# Patient Record
Sex: Male | Born: 1965 | Hispanic: No | Smoking: Current every day smoker
Health system: Southern US, Community
[De-identification: ages and names within clinical notes are randomized; demographics above are authoritative.]

---

## 2002-05-16 ENCOUNTER — Emergency Department (HOSPITAL_COMMUNITY): Admission: AC | Admit: 2002-05-16 | Discharge: 2002-05-16 | Payer: Self-pay

## 2017-04-06 ENCOUNTER — Encounter (HOSPITAL_COMMUNITY): Payer: Self-pay

## 2017-04-06 ENCOUNTER — Emergency Department (HOSPITAL_COMMUNITY): Payer: Self-pay

## 2017-04-06 ENCOUNTER — Emergency Department (HOSPITAL_COMMUNITY)
Admission: EM | Admit: 2017-04-06 | Discharge: 2017-04-06 | Disposition: A | Discharge status: Home / Self Care | Payer: Self-pay | Attending: Emergency Medicine | Admitting: Emergency Medicine

## 2017-04-06 ENCOUNTER — Other Ambulatory Visit: Payer: Self-pay

## 2017-04-06 DIAGNOSIS — M542 Cervicalgia: Secondary | ICD-10-CM | POA: Insufficient documentation

## 2017-04-06 DIAGNOSIS — J039 Acute tonsillitis, unspecified: Secondary | ICD-10-CM | POA: Insufficient documentation

## 2017-04-06 DIAGNOSIS — K420 Umbilical hernia with obstruction, without gangrene: Secondary | ICD-10-CM | POA: Insufficient documentation

## 2017-04-06 DIAGNOSIS — F17210 Nicotine dependence, cigarettes, uncomplicated: Secondary | ICD-10-CM | POA: Insufficient documentation

## 2017-04-06 DIAGNOSIS — R590 Localized enlarged lymph nodes: Secondary | ICD-10-CM | POA: Insufficient documentation

## 2017-04-06 DIAGNOSIS — Z79899 Other long term (current) drug therapy: Secondary | ICD-10-CM | POA: Insufficient documentation

## 2017-04-06 LAB — BASIC METABOLIC PANEL
ANION GAP: 10 (ref 5–15)
BUN: 10 mg/dL (ref 6–20)
CHLORIDE: 102 mmol/L (ref 101–111)
CO2: 25 mmol/L (ref 22–32)
Calcium: 9.4 mg/dL (ref 8.9–10.3)
Creatinine, Ser: 0.8 mg/dL (ref 0.61–1.24)
GFR calc non Af Amer: >60 mL/min (ref >60)
GLUCOSE: 90 mg/dL (ref 65–99)
POTASSIUM: 3.5 mmol/L (ref 3.5–5.1)
Sodium: 137 mmol/L (ref 135–145)

## 2017-04-06 LAB — CBC
HCT: 41.5 % (ref 39.0–52.0)
HEMOGLOBIN: 14.7 g/dL (ref 13.0–17.0)
MCH: 32 pg (ref 26.0–34.0)
MCHC: 35.4 g/dL (ref 30.0–36.0)
MCV: 90.4 fL (ref 78.0–100.0)
Platelets: 332 10*3/uL (ref 150–400)
RBC: 4.59 MIL/uL (ref 4.22–5.81)
RDW: 13.7 % (ref 11.5–15.5)
WBC: 24.6 10*3/uL — AB (ref 4.0–10.5)

## 2017-04-06 LAB — I-STAT CG4 LACTIC ACID, ED: Lactic Acid, Venous: 0.79 mmol/L (ref 0.5–1.9)

## 2017-04-06 LAB — RAPID STREP SCREEN (MED CTR MEBANE ONLY): STREPTOCOCCUS, GROUP A SCREEN (DIRECT): NEGATIVE

## 2017-04-06 MED ORDER — DEXAMETHASONE SODIUM PHOSPHATE 10 MG/ML IJ SOLN
10.0000 mg | Freq: Once | INTRAMUSCULAR | Status: AC
  Administered 2017-04-06: 10 mg via INTRAVENOUS
  Filled 2017-04-06: qty 1

## 2017-04-06 MED ORDER — AMOXICILLIN 400 MG/5ML PO SUSR: 1000.0000 mg | Freq: Three times a day (TID) | ORAL | 0 refills | Status: AC

## 2017-04-06 MED ORDER — HYDROMORPHONE HCL 1 MG/ML IJ SOLN
1.0000 mg | Freq: Once | INTRAMUSCULAR | Status: AC
  Administered 2017-04-06: 1 mg via INTRAVENOUS
  Filled 2017-04-06: qty 1

## 2017-04-06 MED ORDER — MORPHINE SULFATE (PF) 4 MG/ML IV SOLN: 4.0000 mg | Freq: Once | INTRAVENOUS | Status: DC

## 2017-04-06 MED ORDER — IOPAMIDOL (ISOVUE-300) INJECTION 61%
INTRAVENOUS | Status: AC
  Administered 2017-04-06: 75 mL
  Filled 2017-04-06: qty 75

## 2017-04-06 MED ORDER — ACETAMINOPHEN 500 MG PO TABS
1000.0000 mg | ORAL_TABLET | Freq: Once | ORAL | Status: AC
  Administered 2017-04-06: 1000 mg via ORAL
  Filled 2017-04-06: qty 2

## 2017-04-06 MED ORDER — HYDROCODONE-ACETAMINOPHEN 7.5-325 MG/15ML PO SOLN: 15.0000 mL | Freq: Four times a day (QID) | ORAL | 0 refills | Status: AC | PRN

## 2017-04-06 MED ORDER — SODIUM CHLORIDE 0.9 % IV BOLUS (SEPSIS)
1000.0000 mL | Freq: Once | INTRAVENOUS | Status: AC
  Administered 2017-04-06: 1000 mL via INTRAVENOUS

## 2017-04-06 MED ORDER — MORPHINE SULFATE (PF) 4 MG/ML IV SOLN
4.0000 mg | Freq: Once | INTRAVENOUS | Status: AC
  Administered 2017-04-06: 4 mg via INTRAVENOUS
  Filled 2017-04-06: qty 1

## 2017-04-06 MED ORDER — ONDANSETRON HCL 4 MG/2ML IJ SOLN
4.0000 mg | Freq: Once | INTRAMUSCULAR | Status: AC
  Administered 2017-04-06: 4 mg via INTRAVENOUS
  Filled 2017-04-06: qty 2

## 2017-04-06 MED ORDER — CLINDAMYCIN PHOSPHATE 600 MG/50ML IV SOLN
600.0000 mg | Freq: Once | INTRAVENOUS | Status: AC
  Administered 2017-04-06: 600 mg via INTRAVENOUS
  Filled 2017-04-06: qty 50

## 2017-04-06 NOTE — ED Provider Notes (Signed)
[facility]-EMERGENCY DEPT Provider Note   CSN: [id number] Arrival date & time: 04/06/17  4098     History   Chief Complaint Chief Complaint  Patient presents with  . Sore Throat  . Hernia    HPI Samuel Roman is a 52 y.o. male.  HPI  Samuel Roman is a 52yo male with a history of umbilical hernia who presents to the emergency department for multiple complaints.  Patient states that he has a sore throat which began 3 days ago and has been progressively worsening.  Pain is located over the left aspect of his neck and is 10/10 in severity.  It feels "sore" in nature and is worsened with swallowing.  Pain is also worsened with movement of the neck.  He states that it feels like there is a lump in his neck when he tries to swallow.  States that he sometimes has a tough time getting solid food down.  Reports tactile fever and chills at home.  Denies choking or shortness of breath. Denies cough, congestion, headache, body aches, chest pain, abdominal pain, diarrhea, dysuria.   Also states that he has had an umbilical hernia for the past four years now.  Seems to have been getting bigger over the past month.  He is able to reduce it on his own.  He does have some pain with coughing.  Denies nausea/vomiting.  Was in present for the past several years and has not seen a Development worker, international aid. Does not have a PCP.   History reviewed. No pertinent past medical history.  There are no active problems to display for this patient.   History reviewed. No pertinent surgical history.     Home Medications    Prior to Admission medications   Medication Sig Start Date End Date Taking? Authorizing Provider  esomeprazole (NEXIUM) 20 MG capsule Take 20 mg by mouth daily at 12 noon.   Yes [provider]    Family History No family history on file.  Social History Social History   Tobacco Use  . Smoking status: Current Every Day Smoker  . Smokeless tobacco: Never Used    Substance Use Topics  . Alcohol use: Yes  . Drug use: Not on file     Allergies   Patient has no known allergies.   Review of Systems Review of Systems  Constitutional: Negative for fever.  HENT: Positive for sore throat and trouble swallowing (painful, food getting caught). Negative for congestion, ear pain and facial swelling.   Eyes: Negative for visual disturbance.  Respiratory: Negative for shortness of breath.   Cardiovascular: Negative for chest pain.  Gastrointestinal: Positive for abdominal distention (umbilical hernia). Negative for abdominal pain, constipation, diarrhea, nausea and vomiting.  Genitourinary: Negative for dysuria.  Musculoskeletal: Positive for neck pain (left side). Negative for gait problem.  Skin: Negative for wound.  Neurological: Negative for weakness, numbness and headaches.  Psychiatric/Behavioral: Negative for agitation.     Physical Exam Updated Vital Signs BP (!) 156/109 (BP Location: Right Arm)   Pulse 91   Temp 99.3 F (37.4 C) (Oral)   Resp 14   Ht 5\' 9"  (1.753 m)   Wt 81.6 kg (180 lb)   SpO2 100%   BMI 26.58 kg/m   Physical Exam  Constitutional: He is oriented to person, place, and time. He appears well-developed and well-nourished. No distress.  Non-toxic appearing.   HENT:  Head: Normocephalic and atraumatic.  Mucous membranes moist. Posterior oropharynx without erythema. Mild left  tonsillar swelling, no tonsillar exudate. Uvula midline. No trismus. Airway patent. Able to handle oral secretions.   Eyes: Conjunctivae and EOM are normal. Pupils are equal, round, and reactive to light. Right eye exhibits no discharge. Left eye exhibits no discharge.  Neck:  Full neck ROM, although very painful. Tender left anterior cervical lymph node which is enlarged and mobile. No overlying erythema on the skin.  Left neck appears swollen. No erythema, warmth or induration on the neck.   Cardiovascular: Normal rate, regular rhythm and intact  distal pulses. Exam reveals no friction rub.  No murmur heard. Pulmonary/Chest: Effort normal and breath sounds normal. No stridor. No respiratory distress. He has no wheezes. He has no rales.  Abdominal: Soft. Bowel sounds are normal.  Umbilical hernia present, easily reduced. Abdomen soft and non-tender to palpation.   Musculoskeletal: Normal range of motion.  Neurological: He is alert and oriented to person, place, and time. Coordination normal.  Skin: Skin is warm and dry. Capillary refill takes less than 2 seconds. He is not diaphoretic.  Psychiatric: He has a normal mood and affect. His behavior is normal.  Nursing note and vitals reviewed.    ED Treatments / Results  Labs (all labs ordered are listed, but only abnormal results are displayed) Labs Reviewed  CBC - Abnormal; Notable for the following components:      Result Value   WBC 24.6 (*)    All other components within normal limits  RAPID STREP SCREEN (NOT AT [facility])  CULTURE, GROUP A STREP ([facility])  CULTURE, BLOOD (ROUTINE X 2)  CULTURE, BLOOD (ROUTINE X 2)  BASIC METABOLIC PANEL  I-STAT CG4 LACTIC ACID, ED    EKG  EKG Interpretation None       Radiology Ct Soft Tissue Neck W Contrast  Result Date: 04/06/2017 CLINICAL DATA:  Sore throat for 2 days.  Leukocytosis. EXAM: CT NECK WITH CONTRAST TECHNIQUE: Multidetector CT imaging of the neck was performed using the standard protocol following the bolus administration of intravenous contrast. CONTRAST:  75mL ISOVUE-300 IOPAMIDOL (ISOVUE-300) INJECTION 61% COMPARISON:  None. FINDINGS: Pharynx and larynx: There is prominent diffuse enlargement of the left palatine tonsillar soft tissues inferiorly with predominantly decreased density compatible with edema. Edema extends inferiorly into the left aryepiglottic fold. The airway remains patent. No organized peritonsillar fluid collection is identified. There is inflammatory stranding in the left parapharyngeal space, and there is  retropharyngeal edema and small volume fluid without rim enhancement, likely effusion. Salivary glands: Above described inflammatory changes extend into the posterior left submandibular space, however the submandibular glands demonstrate grossly symmetric size and enhancement. The parotid glands are unremarkable. Thyroid: Unremarkable. Lymph nodes: Mild upper cervical lymphadenopathy which is likely reactive. Left-sided lymph nodes measure up to 9 mm in short axis in level IB, 12 mm in level IIA, and 10 mm in level IIB. Vascular: Unremarkable. Limited intracranial: Unremarkable. Visualized orbits: Unremarkable. Mastoids and visualized paranasal sinuses: Mild mucosal thickening and small volume fluid in the left maxillary sinus. Partial opacification of multiple left anterior ethmoid air cells. Minimal right maxillary sinus mucosal thickening. Clear mastoid air cells. Skeleton: No acute osseous abnormality or suspicious osseous lesion. Upper chest: Mild paraseptal emphysema in the lung apices. Other: None. IMPRESSION: 1. Diffusely enlarged and edematous left tonsil with surrounding inflammation/phlegmon consistent with acute tonsillitis. No organized abscess. 2. Small retropharyngeal effusion. 3. Mild reactive lymphadenopathy. Electronically Signed   By: Sebastian AcheAllen  Grady M.D.   On: 04/06/2017 18:41    Procedures Procedures (including critical  care time)  Medications Ordered in ED Medications  morphine 4 MG/ML injection 4 mg (4 mg Intravenous Given 04/06/17 1702)  ondansetron (ZOFRAN) injection 4 mg (4 mg Intravenous Given 04/06/17 1703)  sodium chloride 0.9 % bolus 1,000 mL (0 mLs Intravenous Stopped 04/06/17 1834)  sodium chloride 0.9 % bolus 1,000 mL (0 mLs Intravenous Stopped 04/06/17 1833)  HYDROmorphone (DILAUDID) injection 1 mg (1 mg Intravenous Given 04/06/17 1804)  iopamidol (ISOVUE-300) 61 % injection (75 mLs  Contrast Given 04/06/17 1819)  clindamycin (CLEOCIN) IVPB 600 mg (0 mg Intravenous Stopped  04/06/17 2015)  dexamethasone (DECADRON) injection 10 mg (10 mg Intravenous Given 04/06/17 2058)  acetaminophen (TYLENOL) tablet 1,000 mg (1,000 mg Oral Given 04/06/17 2058)     Initial Impression / Assessment and Plan / ED Course  I have reviewed the triage vital signs and the nursing notes.  Pertinent labs & imaging results that were available during my care of the patient were reviewed by me and considered in my medical decision making (see chart for details).    Patient is non-toxic and in no acute distress. Airway intact. Able to handle oral secretions. He has a low-grade fever of 99.26F on arrival.  Rapid strep negative. Concern for deep space infection given neck swelling and tenderness.   CT soft tissue neck reveals left tonsillitis with retropharyngeal effusion. No abscess. He has a leukocytosis of 24.6. No lactic acidosis.   Patient started on IV clindamycin in the Emergency Department. Discussed this patient who was also seen by Dr. Anitra Lauth who agrees with ENT consult.   Discussed this patient with on-call ENT Dr. Lenon Curt who reviewed patient's CT and suggests IV decadron and sending patient home with amoxicillin. Hycet for pain. He will see patient in the office tomorrow.   On recheck, patient eating a sandwich. Reports his pain has improved. Discussed plan and counseled patient on return precautions. He agrees and voices understanding. Appears reliable to follow up.   In regards to patient's umbilical hernia, it is easily reducible. No concern for strangulation. No nausea/vomiting or pain on abdominal exam to suggest bowel obstruction. Have counseled patient that he can follow up with general surgery for management.   Final Clinical Impressions(s) / ED Diagnoses   Final diagnoses:  Tonsillitis, phlegmonous    ED Discharge Orders        Ordered    HYDROcodone-acetaminophen (HYCET) 7.5-325 mg/15 ml solution  4 times daily PRN     04/06/17 2058    amoxicillin (AMOXIL) 400  MG/5ML suspension  3 times daily     04/06/17 2058       Kellie Shropshire, PA-C 04/07/17 1208    Gwyneth Sprout, MD 04/10/17 [id number]

## 2017-04-06 NOTE — ED Triage Notes (Signed)
He c/o sore throat x 2 days and an umbilical hernia that he states he has had for "years", but "it's gotten bigger lately". He denies vomiting and is in no distress.

## 2017-04-06 NOTE — Discharge Instructions (Signed)
Please take 1000mg  amoxicillin three times a day for the next 3 days. Then take 500mg  amoxicillin for the next 7 days.   You can take Hycet every four hours for pain. This medicine can make you drowsy so please do not drive or work while taking it.  Call throat doctor tomorrow to schedule appointment.  His name is Dr. Lenon CurtWalicki and information to his office is listed below.   Return to the emergency department if you have trouble breathing or can no longer swallow due to pain.

## 2017-04-09 LAB — CULTURE, GROUP A STREP (THRC)

## 2017-04-10 ENCOUNTER — Telehealth: Payer: Self-pay | Admitting: Emergency Medicine

## 2017-04-10 NOTE — Telephone Encounter (Signed)
Post ED Visit - Positive Culture Follow-up  Culture report reviewed by antimicrobial stewardship pharmacist:  []  Enzo BiNathan Batchelder, Pharm.D. []  Celedonio MiyamotoJeremy Frens, Pharm.D., BCPS AQ-ID []  Garvin FilaMike Maccia, Pharm.D., BCPS [x]  Georgina PillionElizabeth Martin, Pharm.D., BCPS []  [place] CityMinh Pham, [address] Rainbow BoulevardPharm.D., BCPS, AAHIVP []  Estella HuskMichelle Turner, Pharm.D., BCPS, AAHIVP []  Lysle Pearlachel Rumbarger, PharmD, BCPS []  Blake DivineShannon Parkey, PharmD []  Pollyann SamplesAndy Johnston, PharmD, BCPS  Positive strep culture Treated with amoxicillin, organism sensitive to the same and no further patient follow-up is required at this time.  Samuel MullMiller, Samuel Roman 04/10/2017, 9:42 AM

## 2017-04-12 LAB — CULTURE, BLOOD (ROUTINE X 2)
CULTURE: NO GROWTH
Culture: NO GROWTH
SPECIAL REQUESTS: ADEQUATE
SPECIAL REQUESTS: ADEQUATE

## 2017-09-17 ENCOUNTER — Encounter (HOSPITAL_COMMUNITY): Payer: Self-pay | Admitting: Emergency Medicine

## 2017-09-17 ENCOUNTER — Emergency Department (HOSPITAL_COMMUNITY)
Admission: EM | Admit: 2017-09-17 | Discharge: 2017-09-17 | Disposition: A | Discharge status: Home / Self Care | Payer: Self-pay | Attending: Emergency Medicine | Admitting: Emergency Medicine

## 2017-09-17 ENCOUNTER — Emergency Department (HOSPITAL_COMMUNITY): Payer: Self-pay

## 2017-09-17 DIAGNOSIS — Z79899 Other long term (current) drug therapy: Secondary | ICD-10-CM | POA: Insufficient documentation

## 2017-09-17 DIAGNOSIS — Y929 Unspecified place or not applicable: Secondary | ICD-10-CM | POA: Insufficient documentation

## 2017-09-17 DIAGNOSIS — W500XXA Accidental hit or strike by another person, initial encounter: Secondary | ICD-10-CM | POA: Insufficient documentation

## 2017-09-17 DIAGNOSIS — Y939 Activity, unspecified: Secondary | ICD-10-CM | POA: Insufficient documentation

## 2017-09-17 DIAGNOSIS — Y998 Other external cause status: Secondary | ICD-10-CM | POA: Insufficient documentation

## 2017-09-17 DIAGNOSIS — F17200 Nicotine dependence, unspecified, uncomplicated: Secondary | ICD-10-CM | POA: Insufficient documentation

## 2017-09-17 DIAGNOSIS — L089 Local infection of the skin and subcutaneous tissue, unspecified: Secondary | ICD-10-CM

## 2017-09-17 DIAGNOSIS — S60921A Unspecified superficial injury of right hand, initial encounter: Secondary | ICD-10-CM

## 2017-09-17 DIAGNOSIS — S63104A Unspecified dislocation of right thumb, initial encounter: Secondary | ICD-10-CM | POA: Insufficient documentation

## 2017-09-17 MED ORDER — TETANUS-DIPHTHERIA TOXOIDS TD 5-2 LFU IM INJ
0.5000 mL | INJECTION | Freq: Once | INTRAMUSCULAR | Status: DC
  Filled 2017-09-17: qty 0.5

## 2017-09-17 MED ORDER — AMOXICILLIN-POT CLAVULANATE 875-125 MG PO TABS: 1.0000 | ORAL_TABLET | Freq: Two times a day (BID) | ORAL | 0 refills | Status: AC

## 2017-09-17 MED ORDER — AMOXICILLIN-POT CLAVULANATE 875-125 MG PO TABS
1.0000 | ORAL_TABLET | Freq: Once | ORAL | Status: AC
Start: 2017-09-17 — End: 2017-09-17
  Administered 2017-09-17: 1 via ORAL
  Filled 2017-09-17 (×2): qty 1
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
## 2017-09-17 NOTE — ED Notes (Signed)
Patient verbalized understanding of discharge instructions, no questions. Patient given bus pass. Patient ambulated out of ED with steady gait in no distress.  

## 2017-09-17 NOTE — Discharge Instructions (Signed)
Please return for any problem.  Please follow-up with Dr. Melvyn Novasrtmann of hand surgery as instructed (you need to be seen within 5-10 days).
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
